# Patient Record
Sex: Male | Born: 1965 | Race: White | Hispanic: No | Marital: Married | Smoking: Never smoker
Health system: Southern US, Community
[De-identification: ages and names within clinical notes are randomized; demographics above are authoritative.]

## PROBLEM LIST (undated history)

## (undated) DIAGNOSIS — E119 Type 2 diabetes mellitus without complications: Secondary | ICD-10-CM

## (undated) DIAGNOSIS — I10 Essential (primary) hypertension: Secondary | ICD-10-CM

## (undated) DIAGNOSIS — G4730 Sleep apnea, unspecified: Secondary | ICD-10-CM

## (undated) DIAGNOSIS — E785 Hyperlipidemia, unspecified: Secondary | ICD-10-CM

## (undated) DIAGNOSIS — F419 Anxiety disorder, unspecified: Secondary | ICD-10-CM

## (undated) HISTORY — DX: Type 2 diabetes mellitus without complications: E11.9

## (undated) HISTORY — DX: Essential (primary) hypertension: I10

## (undated) HISTORY — DX: Hyperlipidemia, unspecified: E78.5

## (undated) HISTORY — DX: Sleep apnea, unspecified: G47.30

## (undated) HISTORY — PX: LIPOMA EXCISION: SHX5283

## (undated) HISTORY — PX: VASECTOMY: SHX75

## (undated) HISTORY — PX: DENTAL RESTORATION/EXTRACTION WITH X-RAY: SHX5796

## (undated) HISTORY — DX: Anxiety disorder, unspecified: F41.9

---

## 2005-08-14 ENCOUNTER — Emergency Department (HOSPITAL_COMMUNITY): Admission: EM | Admit: 2005-08-14 | Discharge: 2005-08-14 | Payer: Self-pay | Admitting: Emergency Medicine

## 2006-12-07 ENCOUNTER — Encounter: Admission: RE | Admit: 2006-12-07 | Discharge: 2006-12-07 | Payer: Self-pay | Admitting: Family Medicine

## 2007-05-12 ENCOUNTER — Encounter: Admission: RE | Admit: 2007-05-12 | Discharge: 2007-05-12 | Payer: Self-pay | Admitting: Family Medicine

## 2011-10-02 DIAGNOSIS — E785 Hyperlipidemia, unspecified: Secondary | ICD-10-CM | POA: Insufficient documentation

## 2011-10-02 DIAGNOSIS — E119 Type 2 diabetes mellitus without complications: Secondary | ICD-10-CM | POA: Insufficient documentation

## 2011-10-02 DIAGNOSIS — I10 Essential (primary) hypertension: Secondary | ICD-10-CM | POA: Insufficient documentation

## 2014-09-11 ENCOUNTER — Ambulatory Visit: Payer: Self-pay | Admitting: Podiatry

## 2014-09-12 ENCOUNTER — Telehealth: Payer: Self-pay | Admitting: *Deleted

## 2014-09-12 ENCOUNTER — Ambulatory Visit (INDEPENDENT_AMBULATORY_CARE_PROVIDER_SITE_OTHER): Payer: 59 | Admitting: Podiatry

## 2014-09-12 VITALS — BP 154/97 | HR 66 | Resp 15

## 2014-09-12 DIAGNOSIS — B070 Plantar wart: Secondary | ICD-10-CM | POA: Diagnosis not present

## 2014-09-12 DIAGNOSIS — M79673 Pain in unspecified foot: Secondary | ICD-10-CM

## 2014-09-12 DIAGNOSIS — B079 Viral wart, unspecified: Secondary | ICD-10-CM

## 2014-09-12 DIAGNOSIS — B078 Other viral warts: Secondary | ICD-10-CM

## 2014-09-12 NOTE — Telephone Encounter (Signed)
Left plantar heel skin wedge sent to [facility] r/o verruca.  Mailed [employer].

## 2014-09-12 NOTE — Progress Notes (Signed)
Subjective:     Patient ID: Cody Howell, male   DOB: November 08, 1965, 49 y.o.   MRN: [id number]  HPI patient presents with a painful lesion on the plantar aspect of the left foot that has been present for several months and is hard to walk on. He is referred by his family physician stating that it is increasingly painful   Review of Systems  All other systems reviewed and are negative.      Objective:   Physical Exam  Constitutional: He is oriented to person, place, and time.  Cardiovascular: Intact distal pulses.   Musculoskeletal: Normal range of motion.  Neurological: He is oriented to person, place, and time.  Skin: Skin is warm.  Nursing note and vitals reviewed.  neurovascular status found to be intact with muscle strength adequate and range of motion subtalar midtarsal joint within normal limits. Patient is found to have good digital perfusion is well oriented 3 and has a lesion on the plantar lateral aspect left heel that upon debridement shows pinpoint bleeding and pain to lateral pressure and measures approximately 1.1 cm x 8 mm     Assessment:     Probable verruca plantaris plantar aspect left foot lateral side    Plan:     H&P condition reviewed with patient. I've recommended excision due to its large size and pain and patient wants this done and I explained procedure and complications with it. Patient willing to accept risk and infiltrated with 60 mg Xylocaine with epinephrine and under sterile technique and anesthesia I did excise the lesion entirely and applied phenol to the base with sterile dressing. We did send it for pathology and if there's any changes I will let him know immediately

## 2014-09-12 NOTE — Patient Instructions (Signed)

## 2014-09-13 ENCOUNTER — Telehealth: Payer: Self-pay | Admitting: *Deleted

## 2014-09-13 ENCOUNTER — Telehealth: Payer: Self-pay | Admitting: Podiatry

## 2014-09-13 NOTE — Telephone Encounter (Signed)
Left message at [phone] (cell #) for patient to call me back regarding their plantar wart that had treated on 09/12/14. Patient called and left a message saying they were doing good.

## 2014-09-13 NOTE — Telephone Encounter (Signed)
PT RETURNED [facility] EVERYTHING IS OK.

## 2014-10-02 ENCOUNTER — Telehealth: Payer: Self-pay | Admitting: *Deleted

## 2014-10-02 NOTE — Telephone Encounter (Signed)
Dr. Paulla Dolly reviewed Biopsy of 09/12/2014 as plantar wart, pt has been informed.  Pt states he is doing fine.

## 2015-04-01 ENCOUNTER — Other Ambulatory Visit: Payer: Self-pay | Admitting: Internal Medicine

## 2015-04-01 DIAGNOSIS — E291 Testicular hypofunction: Secondary | ICD-10-CM

## 2015-11-22 ENCOUNTER — Encounter: Payer: Self-pay | Admitting: Internal Medicine

## 2016-10-19 ENCOUNTER — Encounter: Payer: Self-pay | Admitting: Gastroenterology

## 2016-11-02 ENCOUNTER — Ambulatory Visit (AMBULATORY_SURGERY_CENTER): Payer: Self-pay

## 2016-11-02 VITALS — Ht 70.0 in | Wt 252.0 lb

## 2016-11-02 DIAGNOSIS — Z1211 Encounter for screening for malignant neoplasm of colon: Secondary | ICD-10-CM

## 2016-11-02 MED ORDER — SUPREP BOWEL PREP KIT 17.5-3.13-1.6 GM/177ML PO SOLN: 1.0000 | Freq: Once | ORAL | 0 refills | Status: AC

## 2016-11-02 NOTE — Progress Notes (Signed)
No allergies to egg or soy No diet meds No home oxygen No past problems with anesthesia  OSA  Declined emmi

## 2016-11-12 ENCOUNTER — Telehealth: Payer: Self-pay | Admitting: Gastroenterology

## 2016-11-12 NOTE — Telephone Encounter (Signed)
Okay thanks for letting me know. No charge

## 2016-11-13 ENCOUNTER — Encounter: Payer: Self-pay | Admitting: Gastroenterology

## 2016-12-07 ENCOUNTER — Encounter: Payer: Self-pay | Admitting: *Deleted

## 2016-12-07 ENCOUNTER — Telehealth: Payer: Self-pay | Admitting: Gastroenterology

## 2016-12-07 NOTE — Telephone Encounter (Signed)
Changed instructions for patient with new date and time.  They were printed and mailed to patient.  He was informed l/m on voicemail. Told patient to call if he has any questions.  B.Delan Ksiazek, CMA

## 2016-12-11 ENCOUNTER — Encounter: Payer: Self-pay | Admitting: Gastroenterology

## 2017-01-15 ENCOUNTER — Ambulatory Visit (AMBULATORY_SURGERY_CENTER): Payer: 59 | Admitting: Gastroenterology

## 2017-01-15 ENCOUNTER — Encounter: Payer: Self-pay | Admitting: Gastroenterology

## 2017-01-15 VITALS — BP 116/68 | HR 61 | Temp 97.7°F | Resp 15 | Ht 70.0 in | Wt 252.0 lb

## 2017-01-15 DIAGNOSIS — D123 Benign neoplasm of transverse colon: Secondary | ICD-10-CM

## 2017-01-15 DIAGNOSIS — D122 Benign neoplasm of ascending colon: Secondary | ICD-10-CM

## 2017-01-15 DIAGNOSIS — Z1211 Encounter for screening for malignant neoplasm of colon: Secondary | ICD-10-CM | POA: Diagnosis present

## 2017-01-15 MED ORDER — SODIUM CHLORIDE 0.9 % IV SOLN: 500.0000 mL | Freq: Once | INTRAVENOUS | Status: AC

## 2017-01-15 NOTE — Op Note (Signed)
[facility] Patient Name: Cody Howell Procedure Date: 01/15/2017 1:17 PM MRN: [id number] Endoscopist: Remo Lipps P. Asaad Gulley MD, MD Age: 51 Referring MD:  Date of Birth: 10-18-1965 Gender: Male Account #: [id number] Procedure:                Colonoscopy Indications:              Screening for colorectal malignant neoplasm, This                            is the patient's first colonoscopy Medicines:                Monitored Anesthesia Care Procedure:                Pre-Anesthesia Assessment:                           - Prior to the procedure, a History and Physical                            was performed, and patient medications and                            allergies were reviewed. The patient's tolerance of                            previous anesthesia was also reviewed. The risks                            and benefits of the procedure and the sedation                            options and risks were discussed with the patient.                            All questions were answered, and informed consent                            was obtained. Prior Anticoagulants: The patient has                            taken no previous anticoagulant or antiplatelet                            agents. ASA Grade Assessment: III - A patient with                            severe systemic disease. After reviewing the risks                            and benefits, the patient was deemed in                            satisfactory condition to undergo the procedure.  After obtaining informed consent, the colonoscope                            was passed under direct vision. Throughout the                            procedure, the patient's blood pressure, pulse, and                            oxygen saturations were monitored continuously. The                            Colonoscope was introduced through the anus and                            advanced to  the the cecum, identified by                            appendiceal orifice and ileocecal valve. The                            colonoscopy was performed without difficulty. The                            patient tolerated the procedure well. The quality                            of the bowel preparation was adequate. The                            ileocecal valve, appendiceal orifice, and rectum                            were photographed. Scope In: 1:56:08 PM Scope Out: 2:16:02 PM Scope Withdrawal Time: 0 hours 17 minutes 14 seconds  Total Procedure Duration: 0 hours 19 minutes 54 seconds  Findings:                 The perianal and digital rectal examinations were                            normal.                           A 4 mm polyp was found in the ascending colon. The                            polyp was sessile. The polyp was removed with a                            cold snare. Resection and retrieval were complete.                           A 3 mm polyp was found in the transverse colon. The  polyp was sessile. The polyp was removed with a                            cold snare. Resection and retrieval were complete.                           Internal hemorrhoids were found during retroflexion.                           The exam was otherwise without abnormality. Complications:            No immediate complications. Estimated blood loss:                            Minimal. Estimated Blood Loss:     Estimated blood loss was minimal. Impression:               - One 4 mm polyp in the ascending colon, removed                            with a cold snare. Resected and retrieved.                           - One 3 mm polyp in the transverse colon, removed                            with a cold snare. Resected and retrieved.                           - Internal hemorrhoids.                           - The examination was otherwise normal. Recommendation:            - Patient has a contact number available for                            emergencies. The signs and symptoms of potential                            delayed complications were discussed with the                            patient. Return to normal activities tomorrow.                            Written discharge instructions were provided to the                            patient.                           - Resume previous diet.                           - Continue present medications.                           -  Await pathology results.                           - Repeat colonoscopy is recommended for                            surveillance. The colonoscopy date will be                            determined after pathology results from today's                            exam become available for review. Remo Lipps P. Latricia Cerrito MD, MD 01/15/2017 2:18:57 PM This report has been signed electronically.

## 2017-01-15 NOTE — Patient Instructions (Signed)
YOU HAD AN ENDOSCOPIC PROCEDURE TODAY AT [facility]:   Refer to the procedure report that was given to you for any specific questions about what was found during the examination.  If the procedure report does not answer your questions, please call your gastroenterologist to clarify.  If you requested that your care partner not be given the details of your procedure findings, then the procedure report has been included in a sealed envelope for you to review at your convenience later.  YOU SHOULD EXPECT: Some feelings of bloating in the abdomen. Passage of more gas than usual.  Walking can help get rid of the air that was put into your GI tract during the procedure and reduce the bloating. If you had a lower endoscopy (such as a colonoscopy or flexible sigmoidoscopy) you may notice spotting of blood in your stool or on the toilet paper. If you underwent a bowel prep for your procedure, you may not have a normal bowel movement for a few days.  Please Note:  You might notice some irritation and congestion in your nose or some drainage.  This is from the oxygen used during your procedure.  There is no need for concern and it should clear up in a day or so.  SYMPTOMS TO REPORT IMMEDIATELY:   Following lower endoscopy (colonoscopy or flexible sigmoidoscopy):  Excessive amounts of blood in the stool  Significant tenderness or worsening of abdominal pains  Swelling of the abdomen that is new, acute  Fever of 100F or higher  Please see handouts on Polyps and Hemorhhoids. For urgent or emergent issues, a gastroenterologist can be reached at any hour by calling [phone].   DIET:  We do recommend a small meal at first, but then you may proceed to your regular diet.  Drink plenty of fluids but you should avoid alcoholic beverages for 24 hours.  ACTIVITY:  You should plan to take it easy for the rest of today and you should NOT DRIVE or use heavy machinery until tomorrow (because of  the sedation medicines used during the test).    FOLLOW UP: Our staff will call the number listed on your records the next business day following your procedure to check on you and address any questions or concerns that you may have regarding the information given to you following your procedure. If we do not reach you, we will leave a message.  However, if you are feeling well and you are not experiencing any problems, there is no need to return our call.  We will assume that you have returned to your regular daily activities without incident.  If any biopsies were taken you will be contacted by phone or by letter within the next 1-3 weeks.  Please call us at [phone] if you have not heard about the biopsies in 3 weeks.    SIGNATURES/CONFIDENTIALITY: You and/or your care partner have signed paperwork which will be entered into your electronic medical record.  These signatures attest to the fact that that the information above on your After Visit Summary has been reviewed and is understood.  Full responsibility of the confidentiality of this discharge information lies with you and/or your care-partner.  Thank you for letting us take care of your healthcare needs today.

## 2017-01-15 NOTE — Progress Notes (Signed)
Pt's states no medical or surgical changes since previsit or office visit. 

## 2017-01-15 NOTE — Progress Notes (Signed)
Called to room to assist during endoscopic procedure.  Patient ID and intended procedure confirmed with present staff. Received instructions for my participation in the procedure from the performing physician.  

## 2017-01-15 NOTE — Progress Notes (Signed)
To PACU, VSS. Report to [email or web address] 

## 2017-01-19 ENCOUNTER — Encounter: Payer: Self-pay | Admitting: Gastroenterology

## 2017-01-20 ENCOUNTER — Telehealth: Payer: Self-pay

## 2017-01-20 ENCOUNTER — Telehealth: Payer: Self-pay | Admitting: *Deleted

## 2017-01-20 NOTE — Telephone Encounter (Signed)
Attempted to reach patient for post-procedure f/u call. No answer. Left message that we will attempt to reach him again later today and for him to please call us if he has any questions/concerns regarding his care.

## 2017-01-20 NOTE — Telephone Encounter (Signed)
  Follow up Call-  Call back number 01/15/2017  Post procedure Call Back phone  # [phone]  Permission to leave phone message Yes  Some recent data might be hidden     Patient questions:  Message left to call us if necessary. Second call.

## 2019-01-11 ENCOUNTER — Other Ambulatory Visit: Payer: Self-pay

## 2019-01-11 DIAGNOSIS — E785 Hyperlipidemia, unspecified: Secondary | ICD-10-CM

## 2019-02-07 ENCOUNTER — Other Ambulatory Visit: Payer: Self-pay

## 2019-02-07 ENCOUNTER — Ambulatory Visit (INDEPENDENT_AMBULATORY_CARE_PROVIDER_SITE_OTHER)
Admission: RE | Admit: 2019-02-07 | Discharge: 2019-02-07 | Disposition: A | Discharge status: Home / Self Care | Payer: Self-pay | Source: Ambulatory Visit | Attending: Cardiology | Admitting: Cardiology

## 2019-02-07 DIAGNOSIS — E785 Hyperlipidemia, unspecified: Secondary | ICD-10-CM

## 2020-10-04 IMAGING — CT CT HEART SCORING
2 series · 16 of 20 positions shown, 18 images · non-contrast
Comparison: None.
COMPARISON: None.

Addendum:
EXAM:
OVER-READ INTERPRETATION  CT CHEST

The following report is an over-read performed by radiologist Dr.
Gustave Aro [REDACTED] on 02/07/2019. This
over-read does not include interpretation of cardiac or coronary
anatomy or pathology. The coronary calcium score interpretation by
the cardiologist is attached.
CLINICAL DATA: Risk stratification
Coronary Calcium Score
TECHNIQUE: The patient was scanned on a Siemens Force scanner. Axial
non-contrast 3 mm slices were carried out through the heart. The
data set was analyzed on a dedicated work station and scored using
the Agatson method.

[Series 2: casc 3.0 i36f 2 bestdiast 68 % · axial · 0.41mm/px · z∈[-211,-115]mm · 8 of 42 slices shown, 10 images]
[im 5/42  vessel]
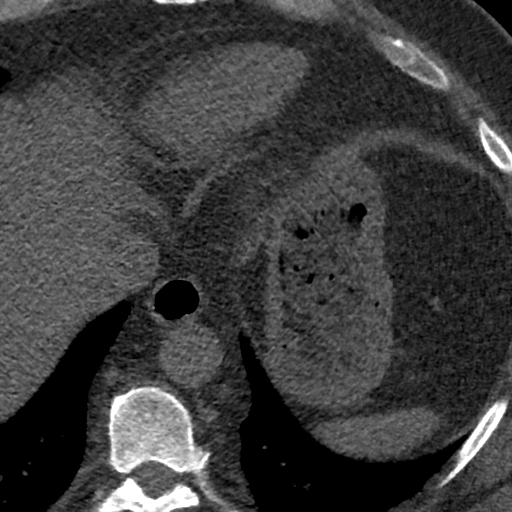
[im 5/42  lung]
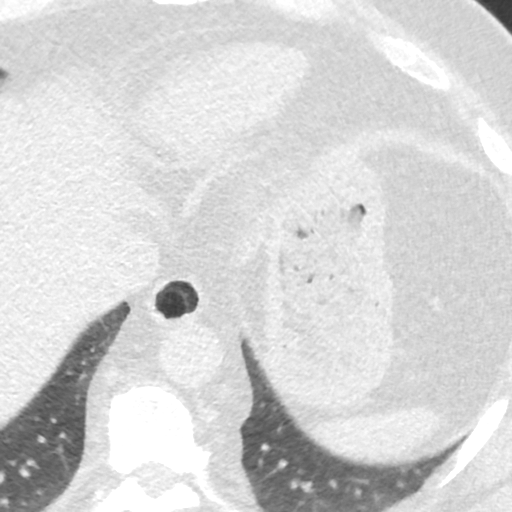
[im 10/42  vessel]
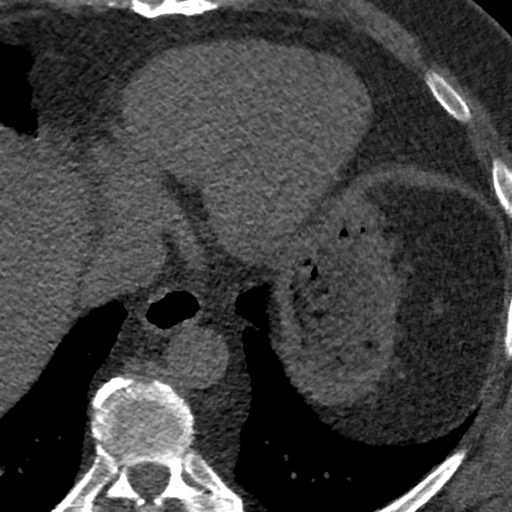
[im 14/42  vessel]
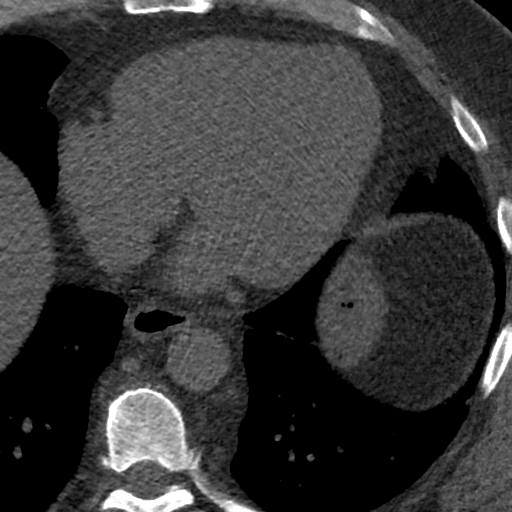
[im 19/42  vessel]
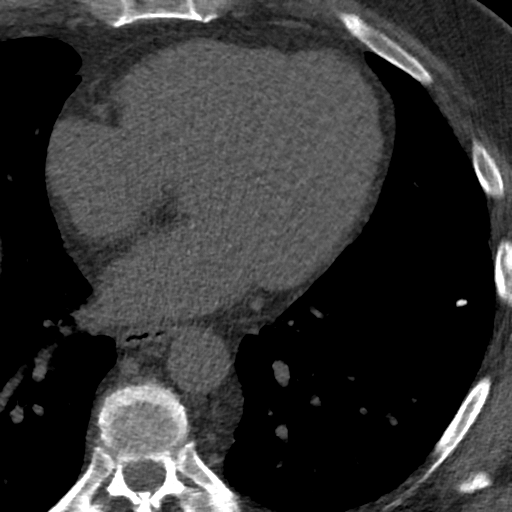
[im 23/42  vessel]
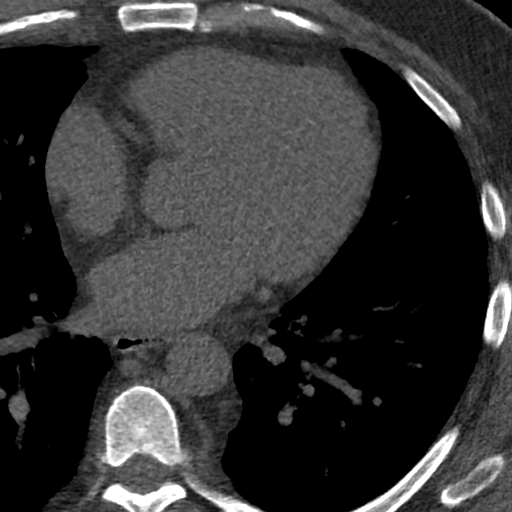
[im 23/42  lung]
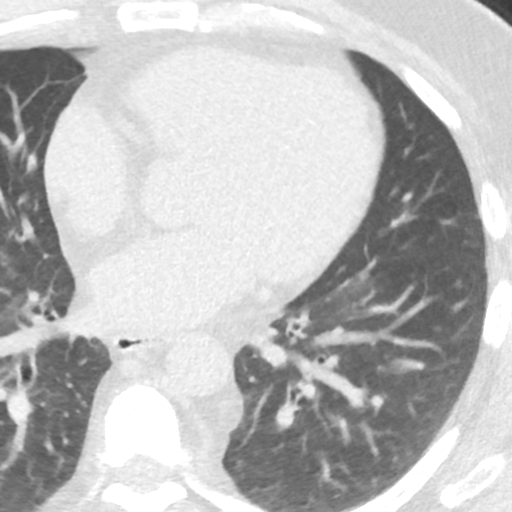
[im 28/42  vessel]
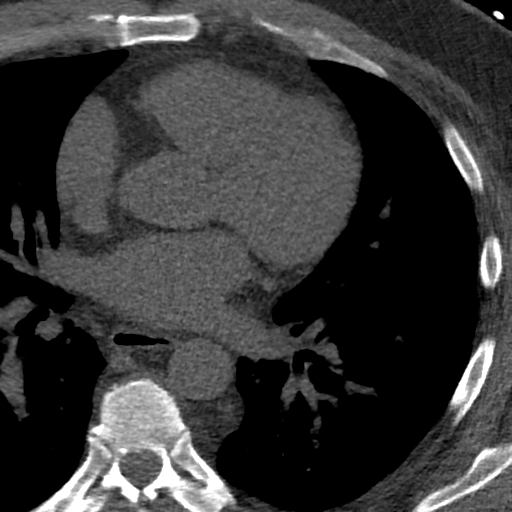
[im 32/42  vessel]
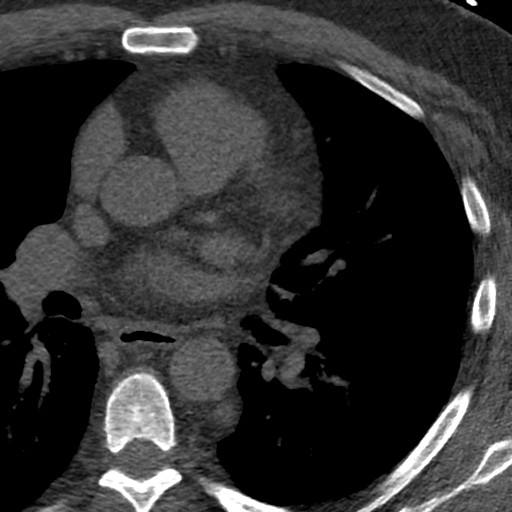
[im 37/42  vessel]
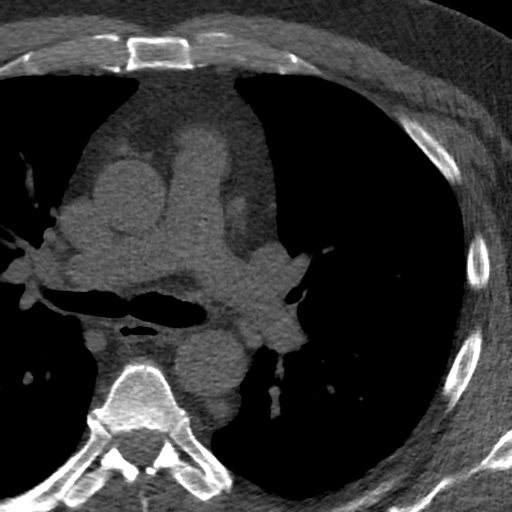

[Series 4: lung st 68 % · axial · 0.73mm/px · z∈[-210,-114]mm · 8 of 42 slices shown]
[im 5/42  lung]
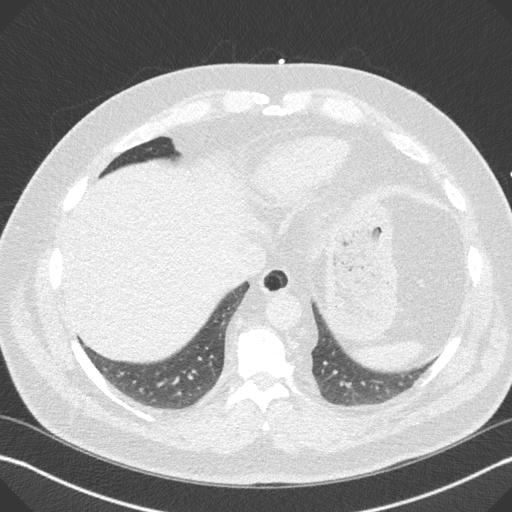
[im 10/42  lung]
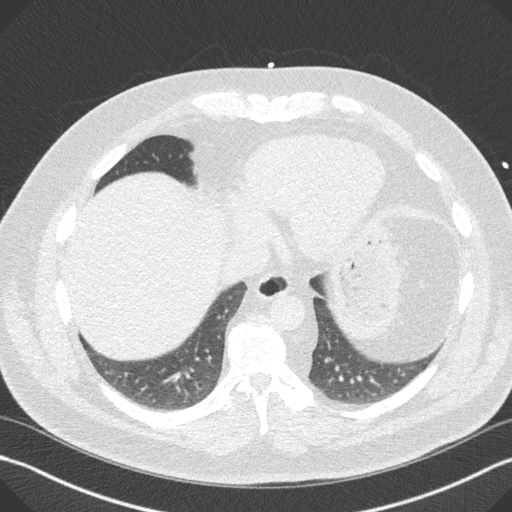
[im 14/42  lung]
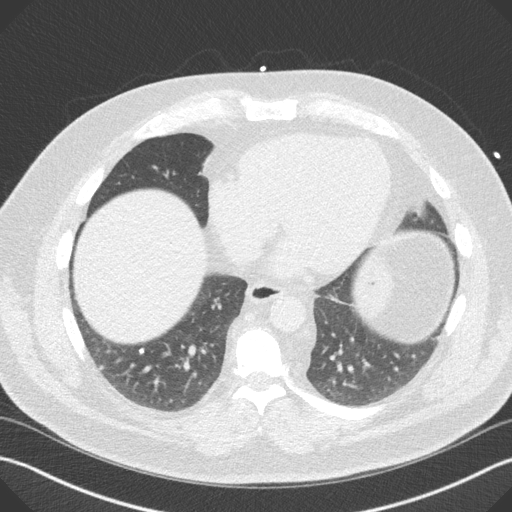
[im 19/42  lung]
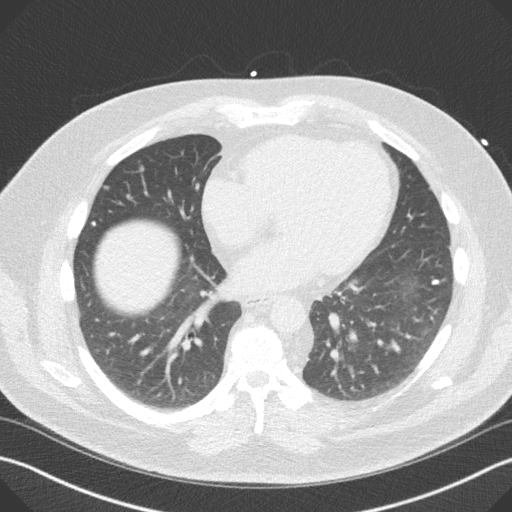
[im 23/42  lung]
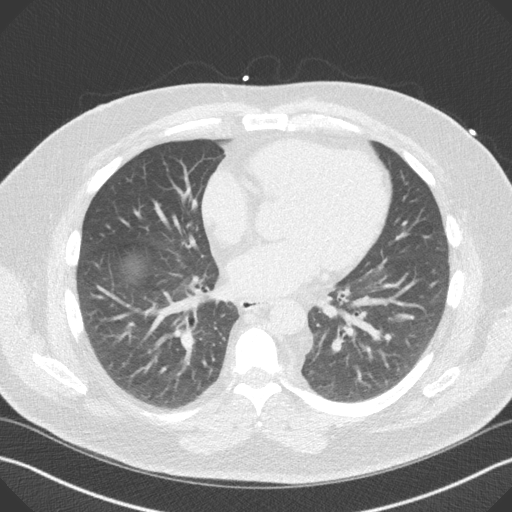
[im 28/42  lung]
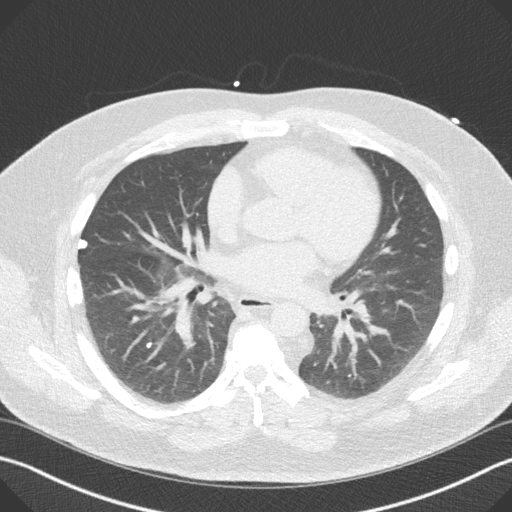
[im 32/42  lung]
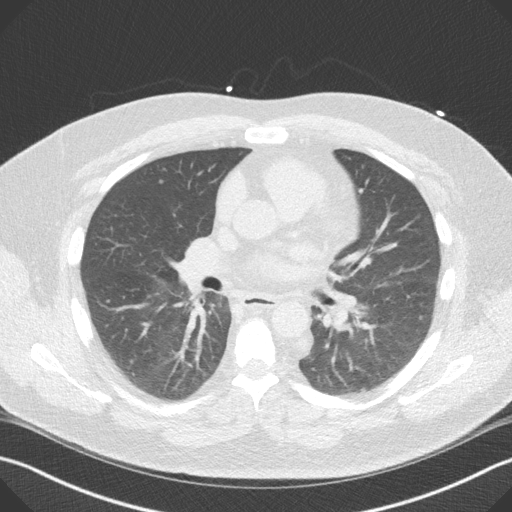
[im 37/42  lung]
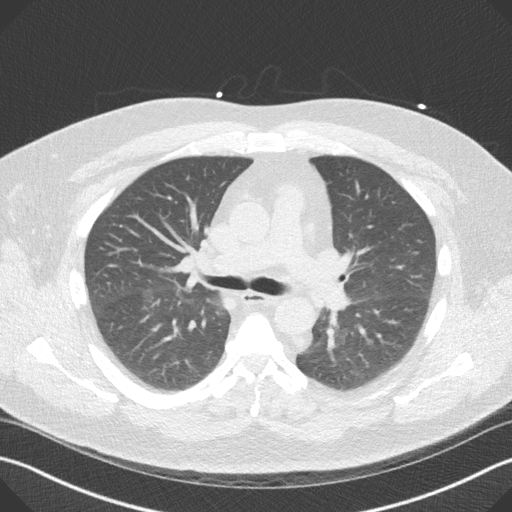

[16 of 20 positions shown; findings below may reference images not displayed]

FINDINGS: Vascular: Heart is normal size.  Visualized aorta is normal caliber.

Mediastinum/Nodes: No adenopathy in the lower mediastinum or hila.

Lungs/Pleura: Calcified granulomas in the lungs bilaterally. No
confluent opacities or effusions.

Upper Abdomen: Imaging into the upper abdomen shows no acute
findings.

Musculoskeletal: Chest wall soft tissues are unremarkable. No acute
bony abnormality.
IMPRESSION: No acute extra cardiac abnormality.

Old granulomatous disease.
FINDINGS: Non-cardiac: See separate report from [REDACTED].

Ascending Aorta: Normal size, no calcifications.

Pericardium: Normal.

Coronary arteries: Normal origin.
IMPRESSION: Coronary calcium score of 33. This was 71 percentile for age and sex
matched control.

*** End of Addendum ***
EXAM:
OVER-READ INTERPRETATION  CT CHEST

The following report is an over-read performed by radiologist Dr.
Gustave Aro [REDACTED] on 02/07/2019. This
over-read does not include interpretation of cardiac or coronary
anatomy or pathology. The coronary calcium score interpretation by
the cardiologist is attached.
FINDINGS: Vascular: Heart is normal size.  Visualized aorta is normal caliber.

Mediastinum/Nodes: No adenopathy in the lower mediastinum or hila.

Lungs/Pleura: Calcified granulomas in the lungs bilaterally. No
confluent opacities or effusions.

Upper Abdomen: Imaging into the upper abdomen shows no acute
findings.

Musculoskeletal: Chest wall soft tissues are unremarkable. No acute
bony abnormality.
IMPRESSION: No acute extra cardiac abnormality.

Old granulomatous disease.

## 2022-03-20 ENCOUNTER — Telehealth: Payer: Self-pay | Admitting: Genetic Counselor

## 2022-03-20 NOTE — Telephone Encounter (Signed)
Scheduled appt per 2/8 referral. Pt is aware of appt date and time. Pt is aware to arrive 15 mins prior to appt time and to bring and updated insurance card. Pt is aware of appt location.

## 2022-03-25 ENCOUNTER — Encounter: Payer: Self-pay | Admitting: Gastroenterology

## 2022-03-27 ENCOUNTER — Ambulatory Visit (AMBULATORY_SURGERY_CENTER): Payer: 59 | Admitting: *Deleted

## 2022-03-27 VITALS — Ht 70.0 in | Wt 225.0 lb

## 2022-03-27 DIAGNOSIS — Z86010 Personal history of colonic polyps: Secondary | ICD-10-CM

## 2022-03-27 MED ORDER — NA SULFATE-K SULFATE-MG SULF 17.5-3.13-1.6 GM/177ML PO SOLN: 1.0000 | Freq: Once | ORAL | 0 refills | Status: AC

## 2022-03-27 NOTE — Progress Notes (Signed)
No egg or soy allergy known to patient  No issues known to pt with past sedation with any surgeries or procedures Patient denies ever being  intubated No issues with head or neck  No issue swallowing   No FH of Malignant Hyperthermia Pt is not on diet pills Pt is not on  home 02  Pt is not on blood thinners  Pt denies issues with constipation  Pt is not on dialysis Pt denies any upcoming cardiac testing . Pt encouraged to use to use Singlecare or Goodrx to reduce cost  Patient's chart not reviewed by Osvaldo Angst CNRA prior to previsit do to pt being an add on. Visit by phone Instructions reviewed with pt and pt states understanding. Instructed to review again prior to procedure. Pt states they will.  Instructions sent by phone with coupon and my chart Instructed to take Trulicity on Saturday before procedure

## 2022-04-07 ENCOUNTER — Encounter: Payer: Self-pay | Admitting: Gastroenterology

## 2022-04-19 ENCOUNTER — Encounter: Payer: Self-pay | Admitting: Certified Registered Nurse Anesthetist

## 2022-04-22 ENCOUNTER — Ambulatory Visit (AMBULATORY_SURGERY_CENTER): Payer: 59 | Admitting: Gastroenterology

## 2022-04-22 ENCOUNTER — Encounter: Payer: Self-pay | Admitting: Gastroenterology

## 2022-04-22 VITALS — BP 153/84 | HR 58 | Temp 97.8°F | Resp 13 | Ht 69.0 in | Wt 225.0 lb

## 2022-04-22 DIAGNOSIS — Z09 Encounter for follow-up examination after completed treatment for conditions other than malignant neoplasm: Secondary | ICD-10-CM

## 2022-04-22 DIAGNOSIS — D122 Benign neoplasm of ascending colon: Secondary | ICD-10-CM | POA: Diagnosis not present

## 2022-04-22 DIAGNOSIS — Z86010 Personal history of colonic polyps: Secondary | ICD-10-CM

## 2022-04-22 MED ORDER — SODIUM CHLORIDE 0.9 % IV SOLN: 500.0000 mL | Freq: Once | INTRAVENOUS | Status: DC

## 2022-04-22 NOTE — Progress Notes (Signed)
Pt's states no medical or surgical changes since previsit or office visit. 

## 2022-04-22 NOTE — Patient Instructions (Signed)
Please read handouts provided. Continue present medications. Await pathology results. Resume previous diet.   YOU HAD AN ENDOSCOPIC PROCEDURE TODAY AT THE [facility]:   Refer to the procedure report that was given to you for any specific questions about what was found during the examination.  If the procedure report does not answer your questions, please call your gastroenterologist to clarify.  If you requested that your care partner not be given the details of your procedure findings, then the procedure report has been included in a sealed envelope for you to review at your convenience later.  YOU SHOULD EXPECT: Some feelings of bloating in the abdomen. Passage of more gas than usual.  Walking can help get rid of the air that was put into your GI tract during the procedure and reduce the bloating. If you had a lower endoscopy (such as a colonoscopy or flexible sigmoidoscopy) you may notice spotting of blood in your stool or on the toilet paper. If you underwent a bowel prep for your procedure, you may not have a normal bowel movement for a few days.  Please Note:  You might notice some irritation and congestion in your nose or some drainage.  This is from the oxygen used during your procedure.  There is no need for concern and it should clear up in a day or so.  SYMPTOMS TO REPORT IMMEDIATELY:  Following lower endoscopy (colonoscopy or flexible sigmoidoscopy):  Excessive amounts of blood in the stool  Significant tenderness or worsening of abdominal pains  Swelling of the abdomen that is new, acute  Fever of 100F or higher.  For urgent or emergent issues, a gastroenterologist can be reached at any hour by calling [phone]. Do not use MyChart messaging for urgent concerns.    DIET:  We do recommend a small meal at first, but then you may proceed to your regular diet.  Drink plenty of fluids but you should avoid alcoholic beverages for 24 hours.  ACTIVITY:  You should  plan to take it easy for the rest of today and you should NOT DRIVE or use heavy machinery until tomorrow (because of the sedation medicines used during the test).    FOLLOW UP: Our staff will call the number listed on your records the next business day following your procedure.  We will call around 7:15- 8:00 am to check on you and address any questions or concerns that you may have regarding the information given to you following your procedure. If we do not reach you, we will leave a message.     If any biopsies were taken you will be contacted by phone or by letter within the next 1-3 weeks.  Please call us at [phone] if you have not heard about the biopsies in 3 weeks.    SIGNATURES/CONFIDENTIALITY: You and/or your care partner have signed paperwork which will be entered into your electronic medical record.  These signatures attest to the fact that that the information above on your After Visit Summary has been reviewed and is understood.  Full responsibility of the confidentiality of this discharge information lies with you and/or your care-partner.  

## 2022-04-22 NOTE — Progress Notes (Signed)
Report given to PACU, vss 

## 2022-04-22 NOTE — Progress Notes (Signed)
[facility] History and Physical   Primary Care Physician:  Crist Infante, MD   Reason for Procedure:   History of colon polyps  Plan:    colonoscopy     HPI: Cody Howell is a 57 y.o. male  here for colonoscopy surveillance - history of 2 small adenomas removed 01/2017.   Patient denies any bowel symptoms at this time. No family history of colon cancer known. Otherwise feels well without any cardiopulmonary symptoms.   I have discussed risks / benefits of anesthesia and endoscopic procedure with Carmelina Noun and they wish to proceed with the exams as outlined today.    Past Medical History:  Diagnosis Date   Anxiety    Diabetes mellitus without complication ([place])    Hyperlipidemia    Hypertension    Sleep apnea     Past Surgical History:  Procedure Laterality Date   DENTAL RESTORATION/EXTRACTION WITH X-RAY     age 23   LIPOMA EXCISION     in his 29's   VASECTOMY     late 55's    Prior to Admission medications   Medication Sig Start Date End Date Taking? Authorizing Provider  amLODipine (NORVASC) 10 MG tablet Take 5 mg by mouth daily.   Yes [provider]  aspirin 81 MG tablet Take 81 mg by mouth daily.   Yes [provider]  chlorthalidone (HYGROTON) 25 MG tablet Take 25 mg by mouth daily.   Yes [provider]  Dulaglutide (TRULICITY) 1.5 0000000 SOPN Inject into the skin. Takes on Sunday   Yes [provider]  escitalopram (LEXAPRO) 10 MG tablet Take 10 mg by mouth daily.   Yes [provider]  irbesartan (AVAPRO) 300 MG tablet Take 300 mg by mouth daily.   Yes [provider]  metFORMIN (GLUCOPHAGE) 1000 MG tablet Take 1,000 mg by mouth 2 (two) times daily with a meal.   Yes [provider]  rosuvastatin (CRESTOR) 5 MG tablet Take 10 mg by mouth daily.   Yes [provider]  fexofenadine (ALLEGRA) 30 MG tablet Take 30 mg by mouth as needed.  Patient not taking: Reported  on 03/27/2022    [provider]  fluticasone (FLONASE) 50 MCG/ACT nasal spray Place into both nostrils daily. Patient not taking: Reported on 03/27/2022    [provider]  Omega-3 Fatty Acids (FISH OIL) 1000 MG CAPS Take by mouth. 2000 units Patient not taking: Reported on 03/27/2022    [provider]  valsartan (DIOVAN) 320 MG tablet Take 320 mg by mouth daily.  Patient not taking: Reported on 03/27/2022    [provider]    Current Outpatient Medications  Medication Sig Dispense Refill   amLODipine (NORVASC) 10 MG tablet Take 5 mg by mouth daily.     aspirin 81 MG tablet Take 81 mg by mouth daily.     chlorthalidone (HYGROTON) 25 MG tablet Take 25 mg by mouth daily.     Dulaglutide (TRULICITY) 1.5 0000000 SOPN Inject into the skin. Takes on Sunday     escitalopram (LEXAPRO) 10 MG tablet Take 10 mg by mouth daily.     irbesartan (AVAPRO) 300 MG tablet Take 300 mg by mouth daily.     metFORMIN (GLUCOPHAGE) 1000 MG tablet Take 1,000 mg by mouth 2 (two) times daily with a meal.     rosuvastatin (CRESTOR) 5 MG tablet Take 10 mg by mouth daily.     fexofenadine (ALLEGRA) 30 MG tablet Take 30 mg  by mouth as needed.  (Patient not taking: Reported on 03/27/2022)     fluticasone (FLONASE) 50 MCG/ACT nasal spray Place into both nostrils daily. (Patient not taking: Reported on 03/27/2022)     Omega-3 Fatty Acids (FISH OIL) 1000 MG CAPS Take by mouth. 2000 units (Patient not taking: Reported on 03/27/2022)     valsartan (DIOVAN) 320 MG tablet Take 320 mg by mouth daily.  (Patient not taking: Reported on 03/27/2022)     Current Facility-Administered Medications  Medication Dose Route Frequency Provider Last Rate Last Admin   0.9 %  sodium chloride infusion  500 mL Intravenous Once Britnee Mcdevitt, Carlota Raspberry, MD       0.9 %  sodium chloride infusion  500 mL Intravenous Once Jacques Willingham, Carlota Raspberry, MD        Allergies as of 04/22/2022   (No Known Allergies)    Family  History  Problem Relation Age of Onset   Colon cancer Neg Hx    Colon polyps Neg Hx    Esophageal cancer Neg Hx    Rectal cancer Neg Hx    Stomach cancer Neg Hx     Social History   Socioeconomic History   Marital status: Married    Spouse name: Not on file   Number of children: Not on file   Years of education: Not on file   Highest education level: Not on file  Occupational History   Not on file  Tobacco Use   Smoking status: Never   Smokeless tobacco: Never  Substance and Sexual Activity   Alcohol use: Yes    Comment: 3 times weekly   Drug use: No   Sexual activity: Not on file  Other Topics Concern   Not on file  Social History Narrative   Not on file   Social Determinants of Health   Financial Resource Strain: Not on file  Food Insecurity: Not on file  Transportation Needs: Not on file  Physical Activity: Not on file  Stress: Not on file  Social Connections: Not on file  Intimate Partner Violence: Not on file    Review of Systems: All other review of systems negative except as mentioned in the HPI.  Physical Exam: Vital signs BP 122/69   Pulse 65   Temp 97.8 F (36.6 C) (Temporal)   Ht '5\' 9"'$  (1.753 m)   Wt 225 lb (102.1 kg)   SpO2 96%   BMI 33.23 kg/m   General:   Alert,  Well-developed, pleasant and cooperative in NAD Lungs:  Clear throughout to auscultation.   Heart:  Regular rate and rhythm Abdomen:  Soft, nontender and nondistended.   Neuro/Psych:  Alert and cooperative. Normal mood and affect. A and O x 3  Jolly Mango, MD [facility]

## 2022-04-22 NOTE — Op Note (Signed)
[facility] Patient Name: Cody Howell Procedure Date: 04/22/2022 9:15 AM MRN: [id number] Endoscopist: Remo Lipps P. Havery Moros , MD, [id number] Age: 57 Referring MD:  Date of Birth: Nov 18, 1965 Gender: Male Account #: [id number] Procedure:                Colonoscopy Indications:              High risk colon cancer surveillance: Personal                            history of colonic polyps - 2 adenomas removed                            01/2017 Medicines:                Monitored Anesthesia Care Procedure:                Pre-Anesthesia Assessment:                           - Prior to the procedure, a History and Physical                            was performed, and patient medications and                            allergies were reviewed. The patient's tolerance of                            previous anesthesia was also reviewed. The risks                            and benefits of the procedure and the sedation                            options and risks were discussed with the patient.                            All questions were answered, and informed consent                            was obtained. Prior Anticoagulants: The patient has                            taken no anticoagulant or antiplatelet agents. ASA                            Grade Assessment: II - A patient with mild systemic                            disease. After reviewing the risks and benefits,                            the patient was deemed in satisfactory condition to  undergo the procedure.                           After obtaining informed consent, the colonoscope                            was passed under direct vision. Throughout the                            procedure, the patient's blood pressure, pulse, and                            oxygen saturations were monitored continuously. The                            CF [id number] was introduced through the  anus                            and advanced to the the cecum, identified by                            appendiceal orifice and ileocecal valve. The                            colonoscopy was performed without difficulty. The                            patient tolerated the procedure well. The quality                            of the bowel preparation was adequate. The                            ileocecal valve, appendiceal orifice, and rectum                            were photographed. Scope In: 9:18:19 AM Scope Out: 9:39:11 AM Scope Withdrawal Time: 0 hours 17 minutes 41 seconds  Total Procedure Duration: 0 hours 20 minutes 52 seconds  Findings:                 The perianal and digital rectal examinations were                            normal.                           A 3 mm polyp was found in the ascending colon. The                            polyp was sessile. The polyp was removed with a                            cold snare. Resection and retrieval were complete.  Internal hemorrhoids were found during                            retroflexion. The hemorrhoids were small.                           The exam was otherwise without abnormality. Complications:            No immediate complications. Estimated blood loss:                            Minimal. Estimated Blood Loss:     Estimated blood loss was minimal. Impression:               - One 3 mm polyp in the ascending colon, removed                            with a cold snare. Resected and retrieved.                           - Internal hemorrhoids.                           - The examination was otherwise normal.                           - The GI Genius (intelligent endoscopy module),                            computer-aided polyp detection system powered by AI                            was utilized to detect colorectal polyps through                            enhanced visualization during  colonoscopy. Recommendation:           - Patient has a contact number available for                            emergencies. The signs and symptoms of potential                            delayed complications were discussed with the                            patient. Return to normal activities tomorrow.                            Written discharge instructions were provided to the                            patient.                           - Resume previous diet.                           -  Continue present medications.                           - Await pathology results. Remo Lipps P. Rober Skeels, MD 04/22/2022 9:44:03 AM This report has been signed electronically.

## 2022-04-22 NOTE — Progress Notes (Signed)
Called to room to assist during endoscopic procedure.  Patient ID and intended procedure confirmed with present staff. Received instructions for my participation in the procedure from the performing physician.  

## 2022-04-23 ENCOUNTER — Telehealth: Payer: Self-pay

## 2022-04-23 NOTE — Telephone Encounter (Signed)
  Follow up Call-     04/22/2022    8:28 AM  Call back number  Post procedure Call Back phone  # [phone]  Permission to leave phone message Yes     Patient questions:  Do you have a fever, pain , or abdominal swelling? No. Pain Score  0 *  Have you tolerated food without any problems? Yes.    Have you been able to return to your normal activities? Yes.    Do you have any questions about your discharge instructions: Diet   No. Medications  No. Follow up visit  No.  Do you have questions or concerns about your Care? No.  Actions: * If pain score is 4 or above: No action needed, pain <4.

## 2022-05-11 ENCOUNTER — Other Ambulatory Visit: Payer: Self-pay

## 2022-05-11 ENCOUNTER — Inpatient Hospital Stay: Payer: 59

## 2022-05-11 ENCOUNTER — Inpatient Hospital Stay: Payer: 59 | Attending: Nurse Practitioner | Admitting: Genetic Counselor

## 2022-05-11 DIAGNOSIS — Z800 Family history of malignant neoplasm of digestive organs: Secondary | ICD-10-CM

## 2022-05-11 DIAGNOSIS — Z801 Family history of malignant neoplasm of trachea, bronchus and lung: Secondary | ICD-10-CM

## 2022-05-11 DIAGNOSIS — Z806 Family history of leukemia: Secondary | ICD-10-CM | POA: Diagnosis not present

## 2022-05-11 DIAGNOSIS — Z8042 Family history of malignant neoplasm of prostate: Secondary | ICD-10-CM | POA: Diagnosis not present

## 2022-05-11 LAB — GENETIC SCREENING ORDER

## 2022-05-12 ENCOUNTER — Encounter: Payer: Self-pay | Admitting: Genetic Counselor

## 2022-05-12 NOTE — Progress Notes (Signed)
REFERRING PROVIDER: Crist Infante, MD [place],  [place] [address]  PRIMARY PROVIDER:  Crist Infante, MD  PRIMARY REASON FOR VISIT:  1. Family history of prostate cancer   2. Family history of stomach cancer   3. Family history of lung cancer     HISTORY OF PRESENT ILLNESS:   Mr. Mccowan, a 57 y.o. male, was seen for a [facility] cancer genetics consultation at the request of Dr. Joylene Draft due to a family history of cancer.  Mr. Shamel presents to clinic today to discuss the possibility of a hereditary predisposition to cancer, to discuss genetic testing, and to further clarify his future cancer risks, as well as potential cancer risks for family members.   Mr. Borseth is a 57 y.o. male with no personal history of cancer.    Past Medical History:  Diagnosis Date   Anxiety    Diabetes mellitus without complication    Hyperlipidemia    Hypertension    Sleep apnea     Past Surgical History:  Procedure Laterality Date   DENTAL RESTORATION/EXTRACTION WITH X-RAY     age 32   LIPOMA EXCISION     in his 48's   VASECTOMY     late 70's    Social History   Socioeconomic History   Marital status: Married    Spouse name: Not on file   Number of children: Not on file   Years of education: Not on file   Highest education level: Not on file  Occupational History   Not on file  Tobacco Use   Smoking status: Never   Smokeless tobacco: Never  Substance and Sexual Activity   Alcohol use: Yes    Comment: 3 times weekly   Drug use: No   Sexual activity: Not on file  Other Topics Concern   Not on file  Social History Narrative   Not on file   Social Determinants of Health   Financial Resource Strain: Not on file  Food Insecurity: Not on file  Transportation Needs: Not on file  Physical Activity: Not on file  Stress: Not on file  Social Connections: Not on file     FAMILY HISTORY:  We obtained a detailed, 4-generation family history.  Significant  diagnoses are listed below: Family History  Problem Relation Age of Onset   Leukemia Mother 75   Lung cancer Father 34   Prostate cancer Maternal Grandfather 78 - 68       metastatic   Stomach cancer Paternal Grandfather 75   Stomach cancer Paternal Great-grandfather 41 - 23   Colon cancer Neg Hx    Colon polyps Neg Hx    Esophageal cancer Neg Hx    Rectal cancer Neg Hx      Mr. Vorndran's father was diagnosed with metastatic lung cancer at age 35 and died at age 18, he smoked, was exposed to radon at home, and worked on a farm. Mr. Schwier's paternal grandfather was diagnosed with stomach cancer at age 73, he died in his 54s. One paternal great uncle and two paternal great aunts had a history of lung cancer, they all smoked and are deceased. His paternal great grandfather (grandfather's father) was diagnosed with stomach cancer in his 59s, he died at age 42. Mr. Laminack's mother was diagnosed with leukemia at age 64, she died at age 53. His maternal grandfather was diagnosed with prostate cancer in his mid 35s, he died due to metastatic prostate cancer at age 63. Mr. Homann  is unaware of previous family history of genetic testing for hereditary cancer risks. There is no reported Ashkenazi Jewish ancestry.  GENETIC COUNSELING ASSESSMENT: Mr. Hardwell is a 57 y.o. male with a family history of cancer which is somewhat suggestive of a hereditary predisposition to cancer. We, therefore, discussed and recommended the following at today's visit.   DISCUSSION: We discussed that 5 - 10% of cancer is hereditary and there are many genes that can be associated with hereditary cancer syndromes.  We discussed that testing is beneficial for several reasons, including knowing about other cancer risks, identifying potential screening and risk-reduction options that may be appropriate, and to understanding if other family members could be at risk for cancer and allowing them to undergo genetic  testing.  We reviewed the characteristics, features and inheritance patterns of hereditary cancer syndromes. We also discussed genetic testing, including the appropriate family members to test, the process of testing, insurance coverage and turn-around-time for results. We discussed the implications of a negative, positive, carrier and/or variant of uncertain significant result.   Mr. Soares was offered a common hereditary cancer panel (34 genes) and an expanded pan-cancer panel (71 genes). Mr. Vandorn was informed of the benefits and limitations of each panel, including that expanded pan-cancer panels contain several genes that do not have clear management guidelines at this point in time. We also discussed that as the number of genes included on a panel increases, the chances of variants of uncertain significance increases.  After considering the benefits and limitations of each gene panel, Mr. Tunis elected to have [facility] CancerNext-Expanded Panel.  The CancerNext-Expanded gene panel offered by [facility] and includes sequencing, rearrangement, and RNA analysis for the following 71 genes: AIP, ALK, APC, ATM, AXIN2, BAP1, BARD1, BMPR1A, BRCA1, BRCA2, BRIP1, CDC73, CDH1, CDK4, CDKN1B, CDKN2A, CHEK2, CTNNA1, DICER1, FH, FLCN, KIF1B, LZTR1, MAX, MEN1, MET, MLH1, MSH2, MSH3, MSH6, MUTYH, NF1, NF2, NTHL1, PALB2, PHOX2B, PMS2, POT1, PRKAR1A, PTCH1, PTEN, RAD51C, RAD51D, RB1, RET, SDHA, SDHAF2, SDHB, SDHC, SDHD, SMAD4, SMARCA4, SMARCB1, SMARCE1, STK11, SUFU, TMEM127, TP53, TSC1, TSC2, and VHL (sequencing and deletion/duplication); EGFR, EGLN1, HOXB13, KIT, MITF, PDGFRA, POLD1, and POLE (sequencing only); EPCAM and GREM1 (deletion/duplication only).    We discussed with Mr. Pickert that the family history does not meet NCCN criteria for genetic testing and, therefore, is not highly consistent with a familial hereditary cancer syndrome. We feel he is at low risk to harbor a gene mutation associated  with such a condition. However, he would still like to pursue genetic testing. We discussed that if his out of pocket cost for testing is over $100, the laboratory will call and confirm whether he wants to proceed with testing.  If the out of pocket cost of testing is less than $100 he will be billed by the genetic testing laboratory.   We discussed that some people do not want to undergo genetic testing due to fear of genetic discrimination.  A federal law called the Genetic Information Non-Discrimination Act (GINA) of 2008 helps protect individuals against genetic discrimination based on their genetic test results.  It impacts both health insurance and employment.  With health insurance, it protects against increased premiums, being kicked off insurance or being forced to take a test in order to be insured.  For employment it protects against hiring, firing and promoting decisions based on genetic test results.  GINA does not apply to those in the [employer], those who work for companies with less than 15 employees, and new life  insurance or long-term disability Engineer, structural.  Health status due to a cancer diagnosis is not protected under GINA.  PLAN: After considering the risks, benefits, and limitations, Mr. Coones provided informed consent to pursue genetic testing and the blood sample was sent to [facility] for analysis of the CancerNext-Expanded Panel. Results should be available within approximately 2-3 weeks' time, at which point they will be disclosed by telephone to Mr. Mogul, as will any additional recommendations warranted by these results. Mr. Charnley will receive a summary of his genetic counseling visit and a copy of his results once available. This information will also be available in Epic.   Mr. Frieders questions were answered to his satisfaction today. Our contact information was provided should additional questions or concerns arise. Thank you for the referral and  allowing [place] to share in the care of your patient.   Lucille Passy, MS, [facility] [email or web address] (P) [phone]  The patient was seen for a total of 30 minutes in face-to-face genetic counseling. The patient was seen alone.  Drs. Lindi Adie and/or Burr Medico were available to discuss this case as needed.  _______________________________________________________________________ For Office Staff:  Number of people involved in session: 1 Was an Intern/ student involved with case: no

## 2022-05-22 ENCOUNTER — Encounter: Payer: Self-pay | Admitting: Genetic Counselor

## 2022-05-22 ENCOUNTER — Telehealth: Payer: Self-pay | Admitting: Genetic Counselor

## 2022-05-22 DIAGNOSIS — Z1379 Encounter for other screening for genetic and chromosomal anomalies: Secondary | ICD-10-CM | POA: Insufficient documentation

## 2022-05-22 NOTE — Telephone Encounter (Signed)
I attempted to contact Mr. Affeldt to discuss his genetic testing results (71 genes). I left a voicemail requesting he call me back at [phone].  Lalla Brothers, MS, [facility] Genetic Counselor [email or web address] (P) [phone]

## 2022-05-26 ENCOUNTER — Telehealth: Payer: Self-pay | Admitting: Genetic Counselor

## 2022-05-26 NOTE — Telephone Encounter (Signed)
I contacted Mr. Provencal to discuss his genetic testing results. No pathogenic variants were identified in the 71 genes analyzed. Detailed clinic note to follow.  The test report has been scanned into EPIC and is located under the Molecular Pathology section of the Results Review tab.  A portion of the result report is included below for reference.   Lalla Brothers, MS, [facility] Genetic Counselor [email or web address] (P) [phone]

## 2022-06-01 ENCOUNTER — Ambulatory Visit: Payer: Self-pay | Admitting: Genetic Counselor

## 2022-06-01 ENCOUNTER — Encounter: Payer: Self-pay | Admitting: Genetic Counselor

## 2022-06-01 DIAGNOSIS — Z1379 Encounter for other screening for genetic and chromosomal anomalies: Secondary | ICD-10-CM

## 2022-06-01 NOTE — Progress Notes (Signed)
HPI:   Cody Howell was previously seen in the [facility] due to a family history of cancer and concerns regarding a hereditary predisposition to cancer. Please refer to our prior cancer genetics clinic note for more information regarding our discussion, assessment and recommendations, at the time. Cody Howell recent genetic test results were disclosed to him, as were recommendations warranted by these results. These results and recommendations are discussed in more detail below.  FAMILY HISTORY:  We obtained a detailed, 4-generation family history.  Significant diagnoses are listed below:      Family History  Problem Relation Age of Onset   Leukemia Mother 48   Lung cancer Father 93   Prostate cancer Maternal Grandfather 20 - 68        metastatic   Stomach cancer Paternal Grandfather 31   Stomach cancer Paternal Great-grandfather 69 - 55   Colon cancer Neg Hx     Colon polyps Neg Hx     Esophageal cancer Neg Hx     Rectal cancer Neg Hx         Cody Howell's father was diagnosed with metastatic lung cancer at age 42 and died at age 60, he smoked, was exposed to radon at home, and worked on a farm. Cody Howell's paternal grandfather was diagnosed with stomach cancer at age 50, he died in his 50s. One paternal great uncle and two paternal great aunts had a history of lung cancer, they all smoked and are deceased. His paternal great grandfather (grandfather's father) was diagnosed with stomach cancer in his 24s, he died at age 97. Cody Howell's mother was diagnosed with leukemia at age 41, she died at age 27. His maternal grandfather was diagnosed with prostate cancer in his mid 30s, he died due to metastatic prostate cancer at age 19. Cody Howell is unaware of previous family history of genetic testing for hereditary cancer risks. There is no reported Ashkenazi Jewish ancestry.  GENETIC TEST RESULTS:  The [facility] CancerNext-Expanded Panel found no pathogenic  mutations.   The CancerNext-Expanded gene panel offered by [facility] and includes sequencing, rearrangement, and RNA analysis for the following 71 genes: AIP, ALK, APC, ATM, AXIN2, BAP1, BARD1, BMPR1A, BRCA1, BRCA2, BRIP1, CDC73, CDH1, CDK4, CDKN1B, CDKN2A, CHEK2, CTNNA1, DICER1, FH, FLCN, KIF1B, LZTR1, MAX, MEN1, MET, MLH1, MSH2, MSH3, MSH6, MUTYH, NF1, NF2, NTHL1, PALB2, PHOX2B, PMS2, POT1, PRKAR1A, PTCH1, PTEN, RAD51C, RAD51D, RB1, RET, SDHA, SDHAF2, SDHB, SDHC, SDHD, SMAD4, SMARCA4, SMARCB1, SMARCE1, STK11, SUFU, TMEM127, TP53, TSC1, TSC2, and VHL (sequencing and deletion/duplication); EGFR, EGLN1, HOXB13, KIT, MITF, PDGFRA, POLD1, and POLE (sequencing only); EPCAM and GREM1 (deletion/duplication only).    The test report has been scanned into EPIC and is located under the Molecular Pathology section of the Results Review tab.  A portion of the result report is included below for reference. Genetic testing reported out on 05/21/2022.      Even though a pathogenic variant was not identified, possible explanations for the cancer in the family may include: There may be no hereditary risk for cancer in the family. The cancers in his family may be due to other genetic or environmental factors. There may be a gene mutation in one of these genes that current testing methods cannot detect, but that chance is small. There could be another gene that has not yet been discovered, or that we have not yet tested, that is responsible for the cancer diagnoses in the family.  It is also possible there is  a hereditary cause for the cancer in the family that Cody Howell did not inherit.  Therefore, it is important to remain in touch with cancer genetics in the future so that we can continue to offer Cody Howell the most up to date genetic testing.   ADDITIONAL GENETIC TESTING:  We discussed with Cody Howell that his genetic testing was fairly extensive.  If there are genes identified to increase cancer  risk that can be analyzed in the future, we would be happy to discuss and coordinate this testing at that time.    CANCER SCREENING RECOMMENDATIONS:  Cody Howell's test result is considered negative (normal).  This means that we have not identified a hereditary cause for his family history of cancer at this time.   An individual's cancer risk and medical management are not determined by genetic test results alone. Overall cancer risk assessment incorporates additional factors, including personal medical history, family history, and any available genetic information that may result in a personalized plan for cancer prevention and surveillance. Therefore, it is recommended he continue to follow the cancer management and screening guidelines provided by his primary healthcare provider.  RECOMMENDATIONS FOR FAMILY MEMBERS:   Since he did not inherit a mutation in a cancer predisposition gene included on this panel, his children could not have inherited a mutation from him in one of these genes.  FOLLOW-UP:  Cancer genetics is a rapidly advancing field and it is possible that new genetic tests will be appropriate for him and/or his family members in the future. We encouraged him to remain in contact with cancer genetics on an annual basis so we can update his personal and family histories and let him know of advances in cancer genetics that may benefit this family.   Our contact number was provided. Cody Howell's questions were answered to his satisfaction, and he knows he is welcome to call us at anytime with additional questions or concerns.   Lalla Brothers, MS, [facility] Genetic Counselor [email or web address] (P) [phone]

## 2023-06-07 ENCOUNTER — Other Ambulatory Visit: Payer: Self-pay | Admitting: Internal Medicine

## 2023-06-07 DIAGNOSIS — R221 Localized swelling, mass and lump, neck: Secondary | ICD-10-CM

## 2023-06-16 ENCOUNTER — Ambulatory Visit
Admission: RE | Admit: 2023-06-16 | Discharge: 2023-06-16 | Discharge status: Home / Self Care | Source: Ambulatory Visit | Attending: Internal Medicine | Admitting: Internal Medicine

## 2023-06-16 DIAGNOSIS — R221 Localized swelling, mass and lump, neck: Secondary | ICD-10-CM

## 2023-06-16 MED ORDER — IOPAMIDOL (ISOVUE-300) INJECTION 61%
75.0000 mL | Freq: Once | INTRAVENOUS | Status: AC | PRN
  Administered 2023-06-16: 75 mL via INTRAVENOUS

## 2024-03-02 ENCOUNTER — Other Ambulatory Visit: Payer: Self-pay | Admitting: Urology

## 2024-03-02 DIAGNOSIS — R972 Elevated prostate specific antigen [PSA]: Secondary | ICD-10-CM

## 2024-04-21 ENCOUNTER — Other Ambulatory Visit
